# Patient Record
Sex: Female | Born: 1955 | Race: White | Hispanic: No | Smoking: Current every day smoker
Health system: Southern US, Community
[De-identification: ages and names within clinical notes are randomized; demographics above are authoritative.]

## PROBLEM LIST (undated history)

## (undated) DIAGNOSIS — F329 Major depressive disorder, single episode, unspecified: Secondary | ICD-10-CM

## (undated) DIAGNOSIS — F32A Depression, unspecified: Secondary | ICD-10-CM

## (undated) DIAGNOSIS — F419 Anxiety disorder, unspecified: Secondary | ICD-10-CM

## (undated) DIAGNOSIS — I10 Essential (primary) hypertension: Secondary | ICD-10-CM

## (undated) DIAGNOSIS — I669 Occlusion and stenosis of unspecified cerebral artery: Secondary | ICD-10-CM

---

## 2013-01-27 ENCOUNTER — Ambulatory Visit: Payer: Self-pay | Admitting: Family Medicine

## 2013-01-30 ENCOUNTER — Ambulatory Visit: Payer: Self-pay | Admitting: Ophthalmology

## 2013-01-30 LAB — CREATININE, SERUM
Creatinine: 0.87 mg/dL (ref 0.60–1.30)
EGFR (African American): >60
EGFR (Non-African Amer.): >60

## 2013-02-04 ENCOUNTER — Emergency Department: Payer: Self-pay | Admitting: Emergency Medicine

## 2013-03-02 ENCOUNTER — Ambulatory Visit: Payer: Self-pay | Admitting: Vascular Surgery

## 2013-03-23 ENCOUNTER — Inpatient Hospital Stay: Payer: Self-pay | Admitting: Vascular Surgery

## 2013-03-23 LAB — BUN: BUN: 8 mg/dL (ref 7–18)

## 2013-03-23 LAB — CREATININE, SERUM: EGFR (Non-African Amer.): >60

## 2013-03-24 LAB — CBC WITH DIFFERENTIAL/PLATELET
Basophil #: 0.1 10*3/uL (ref 0.0–0.1)
Eosinophil #: 0.2 10*3/uL (ref 0.0–0.7)
Eosinophil %: 2.8 %
HCT: 33 % — ABNORMAL LOW (ref 35.0–47.0)
HGB: 11.4 g/dL — ABNORMAL LOW (ref 12.0–16.0)
Lymphocyte %: 29.3 %
MCV: 94 fL (ref 80–100)
Monocyte #: 0.6 x10 3/mm (ref 0.2–0.9)
Neutrophil #: 4.5 10*3/uL (ref 1.4–6.5)
Platelet: 163 10*3/uL (ref 150–440)
RBC: 3.5 10*6/uL — ABNORMAL LOW (ref 3.80–5.20)
WBC: 7.7 10*3/uL (ref 3.6–11.0)

## 2013-03-24 LAB — BASIC METABOLIC PANEL
BUN: 8 mg/dL (ref 7–18)
Chloride: 111 mmol/L — ABNORMAL HIGH (ref 98–107)
Co2: 25 mmol/L (ref 21–32)
Creatinine: 0.75 mg/dL (ref 0.60–1.30)
EGFR (African American): >60
EGFR (Non-African Amer.): >60
Osmolality: 280 (ref 275–301)
Potassium: 3.7 mmol/L (ref 3.5–5.1)
Sodium: 141 mmol/L (ref 136–145)

## 2013-03-24 LAB — PROTIME-INR: Prothrombin Time: 14.4 secs (ref 11.5–14.7)

## 2013-08-13 ENCOUNTER — Ambulatory Visit: Payer: Self-pay | Admitting: Vascular Surgery

## 2014-03-01 ENCOUNTER — Emergency Department: Payer: Self-pay | Admitting: Emergency Medicine

## 2014-03-01 LAB — CBC WITH DIFFERENTIAL/PLATELET
Basophil #: 0 10*3/uL (ref 0.0–0.1)
Basophil %: 0.4 %
Eosinophil #: 0.2 10*3/uL (ref 0.0–0.7)
Eosinophil %: 2.2 %
HCT: 43.9 % (ref 35.0–47.0)
HGB: 14.3 g/dL (ref 12.0–16.0)
LYMPHS ABS: 2.6 10*3/uL (ref 1.0–3.6)
Lymphocyte %: 34.4 %
MCH: 31.3 pg (ref 26.0–34.0)
MCHC: 32.6 g/dL (ref 32.0–36.0)
MCV: 96 fL (ref 80–100)
MONO ABS: 0.5 x10 3/mm (ref 0.2–0.9)
Monocyte %: 6.7 %
Neutrophil #: 4.2 10*3/uL (ref 1.4–6.5)
Neutrophil %: 56.3 %
Platelet: 210 10*3/uL (ref 150–440)
RBC: 4.57 10*6/uL (ref 3.80–5.20)
RDW: 13.4 % (ref 11.5–14.5)
WBC: 7.4 10*3/uL (ref 3.6–11.0)

## 2014-03-01 LAB — BASIC METABOLIC PANEL
ANION GAP: 8 (ref 7–16)
BUN: 14 mg/dL (ref 7–18)
CO2: 26 mmol/L (ref 21–32)
Calcium, Total: 9.1 mg/dL (ref 8.5–10.1)
Chloride: 106 mmol/L (ref 98–107)
Creatinine: 0.79 mg/dL (ref 0.60–1.30)
EGFR (African American): >60
Glucose: 106 mg/dL — ABNORMAL HIGH (ref 65–99)
OSMOLALITY: 280 (ref 275–301)
POTASSIUM: 4.4 mmol/L (ref 3.5–5.1)
Sodium: 140 mmol/L (ref 136–145)

## 2014-03-01 LAB — D-DIMER(ARMC): D-DIMER: 1743 ng/mL

## 2014-03-01 LAB — PROTIME-INR
INR: 1.1
Prothrombin Time: 13.6 secs (ref 11.5–14.7)

## 2014-03-01 LAB — URIC ACID: Uric Acid: 4.6 mg/dL (ref 2.6–6.0)

## 2014-04-20 IMAGING — CR RIGHT FOOT COMPLETE - 3+ VIEW
1 series · 3 of 3 positions shown · non-contrast
Comparison: none

REASON FOR EXAM: dorsal foot pain
COMMENTS:

[Series 1: ap · 0.17mm/px · 3 of 3 slices shown]
[im 1/3]
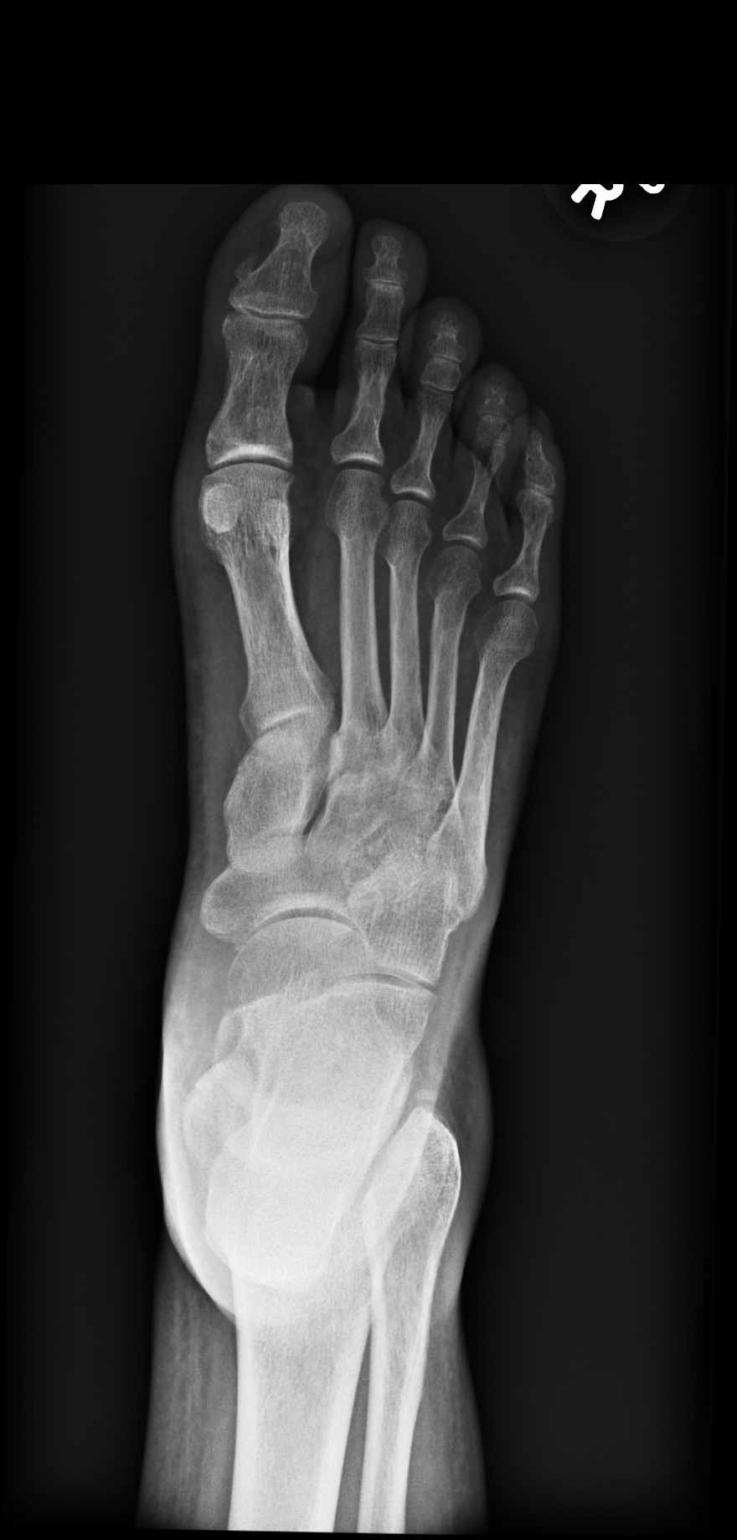
[im 2/3]
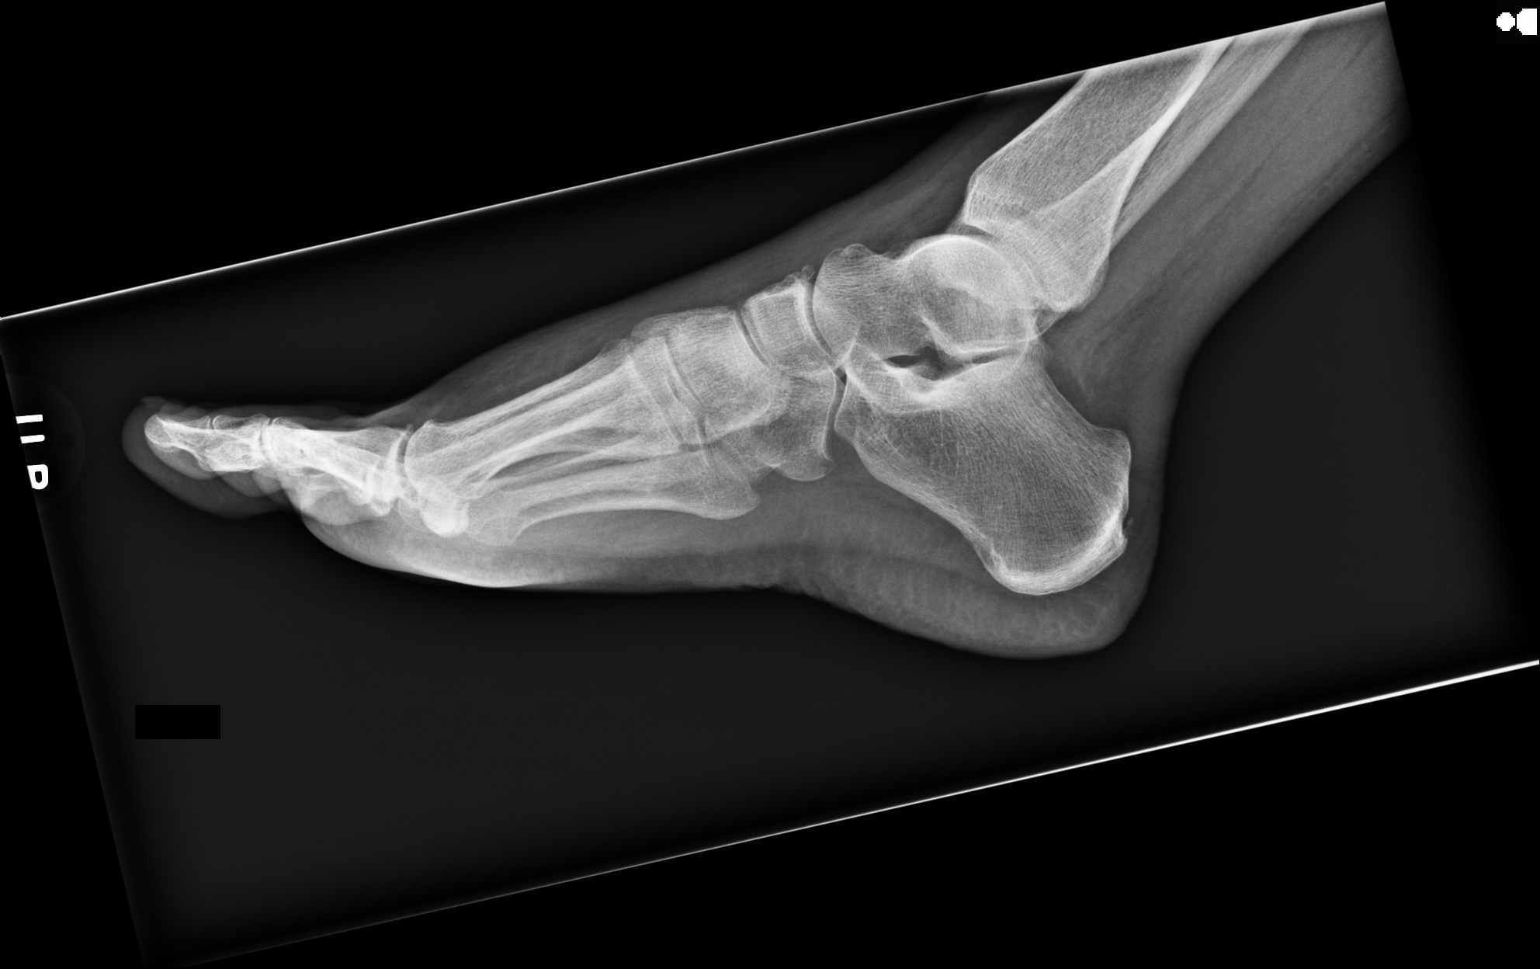
[im 3/3]
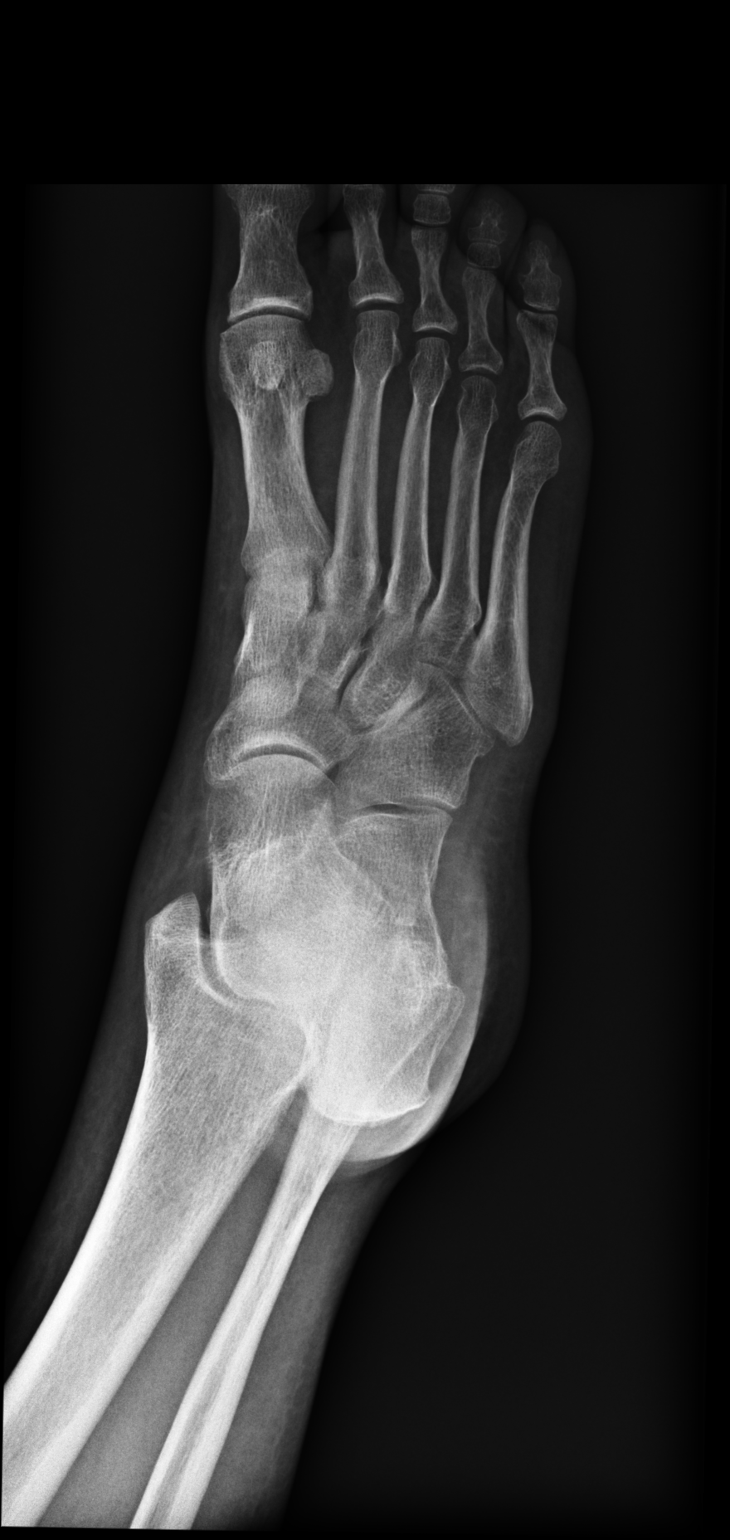

[3 of 3 positions shown; findings below may reference images not displayed]

PROCEDURE:     DXR - DXR FOOT RT COMPLETE W/OBLIQUES  - February 04, 2013  [DATE]

RESULT:     Three views of the right foot reveal mild osteopenia. There is
no lytic or blastic lesion. No erosive changes are demonstrated. There is
mild narrowing of the first metatarsophalangeal joint. There is no evidence
of periosteal reaction. There is a tiny Achilles region calcaneal spur. The
overlying soft tissues exhibit no acute abnormalities.
IMPRESSION: There are no radiographic findings to suggest erosive
arthropathy. No more than mild osteoarthritic changes are demonstrated.

[REDACTED]

## 2014-11-26 NOTE — Op Note (Signed)
PATIENT NAME:  Valerie Orozco, [name] MR#:  [id number] DATE OF BIRTH:  02-11-1956  DATE OF PROCEDURE:  03/23/2013  PREOPERATIVE DIAGNOSES: 1.  High-grade left carotid artery stenosis.  2.  Previous amaurosis fugax, left eye.  3.  Hypertension.  4.  Anxiety/depression.   POSTOPERATIVE DIAGNOSES: 1.  High-grade left carotid artery stenosis.  2.  Previous amaurosis fugax, left eye.  3.  Hypertension.  4.  Anxiety/depression.   PROCEDURES: 1.  Ultrasound guidance for vascular access, right femoral artery.  2.  Catheter placement to left internal carotid artery from right femoral approach.  3.  Thoracic aortogram and left cervical and cerebral carotid angiograms.  4.  Placement of a 9 x 7 tapered Xact carotid stent with the use of the NAV6 embolic protection device.  5.  StarClose closure device,  right femoral artery.   SURGEON: Annice NeedyJason S. Zulema Pulaski, M.D.   ANESTHESIA: Local with moderate sedation.   ESTIMATED BLOOD LOSS: Approximately 100 mL.   FLUOROSCOPY TIME: 17 minutes.  CONTRAST USED:  75 mL.   INDICATION FOR PROCEDURE: This is a 59 year old white female who I saw in the office for carotid disease. She had an episode of amaurosis fugax with gradual improvement of her visual symptoms. She had on ultrasound and then a CT angiogram, both of which suggested high-grade left carotid artery stenosis. This was on the side ipsilateral to her visual symptoms. Her CT angiogram also demonstrated a high carotid bifurcation with a lesion that would be difficult to treat surgically, and for this reason we discussed options for treatment, and a carotid artery stent was selected. Risks and benefits of the procedure were discussed in detail with the patient and informed consent was obtained.   DESCRIPTION OF PROCEDURE: The patient is brought to the vascular and ventral radiology suite. Groins were sterilely prepped and draped, and a sterile surgical field was created. The right femoral head was localized with  fluoroscopy, and the right femoral artery was accessed without difficulty with Seldinger needle. A J-wire and 6-French sheath were then placed. The patient was given 6000 units of intravenous heparin for systemic anticoagulation, and her ACT was found to be over 300 seconds. A pigtail catheter was then placed in the ascending aorta and LAO projection thoracic aortogram was performed. This demonstrated a difficult arch for cannulation with significant reverse curve of the left common carotid artery. There were separate origins of the innominate, left common carotid and left subclavian arteries. I initially tried a JB-2 and then a Bentson-Hanafee catheter but was unable to get these to track with the difficult reverse curve in the left common carotid artery. I had to get a VTK catheter and had to selectively wire the external carotid artery well out into its secondary branches to be able to get purchase to advance the VTK catheter. Once this advanced, I advanced it all the way into the external carotid artery, and then placed the Amplatz Super Stiff wire with a 1 cm floppy tip over this platform. The diagnostic catheter was removed, and a 6-French Shuttle sheath was introduced into the left common carotid artery. At this point, the stiff wire and dilator were removed. Imaging was performed, which demonstrated a 95% stenosis of the left internal carotid artery, less than 1 cm beyond its origin. There was a significant amount of tortuosity and nearly a kink in the internal carotid artery 4 to 5 cm beyond the lesion. Initially, the NAV6 embolic protection device and wire were able to cross the actual  lesion but did not want to make the curve around the kinked portion. At this point, I used a floppy Glidewire as a buddy wire system, and with this in place, I replaced the NAV6 embolic protection device without difficulty into the distal internal carotid artery, removing the Glidewire. Imaging was then performed in the  cervical and cerebral portions. The intracerebral flow was found to be negligible through the anterior cerebral artery and diminished through the middle cerebral artery on initial imaging. The cervical lesion was greater than 90 to 95% in both projections, in lateral and in LAO projection. A 97 tapered 4 cm long Xact stent was then brought onto the field and introduced over the wire encompassing the lesion. She was pretreated with atropine as her baseline heart rate was in the 50s. The stent was deployed encompassing the lesion in excellent location, and then was postdilated with a 5 mm diameter angioplasty balloon with excellent angiographic completion result and less than 20% residual stenosis. After pretreatment with the atropine, she did not develop any bradycardia or hypotension. The embolic protection device was then retrieved and completion angiogram imaging was performed. This showed the excellent stent location and patent stent. The intracerebral flow now showed a small amount of flow in the anterior cerebral artery, which was not present pre-stent placement, and the middle cerebral artery also was slightly more brisk without a focal deficit appreciated. The patient remained neurologically intact throughout the procedure. I then brought the sheath down to the external iliac artery and an oblique arteriogram was performed. StarClose closure device was deployed in the usual fashion with good hemostatic result, and pressure was held on the groin as well. Sterile pressure dressing was placed. The patient tolerated the procedure well and was taken to the recovery room in stable condition.      ____________________________ Annice Needy, MD jsd:dmm D: 03/23/2013 09:36:24 ET T: 03/23/2013 09:51:05 ET JOB#: [id number]  cc: Annice Needy, MD, <Dictator> Deloris Ping, NP Annice Needy MD ELECTRONICALLY SIGNED 03/23/2013 16:09

## 2015-08-15 ENCOUNTER — Emergency Department
Admission: EM | Admit: 2015-08-15 | Discharge: 2015-08-15 | Disposition: A | Discharge status: Home / Self Care | Payer: Medicare Other | Attending: Emergency Medicine | Admitting: Emergency Medicine

## 2015-08-15 ENCOUNTER — Encounter: Payer: Self-pay | Admitting: Emergency Medicine

## 2015-08-15 DIAGNOSIS — B029 Zoster without complications: Secondary | ICD-10-CM | POA: Insufficient documentation

## 2015-08-15 DIAGNOSIS — R21 Rash and other nonspecific skin eruption: Secondary | ICD-10-CM | POA: Diagnosis present

## 2015-08-15 DIAGNOSIS — F17200 Nicotine dependence, unspecified, uncomplicated: Secondary | ICD-10-CM | POA: Insufficient documentation

## 2015-08-15 DIAGNOSIS — I10 Essential (primary) hypertension: Secondary | ICD-10-CM | POA: Diagnosis not present

## 2015-08-15 HISTORY — DX: Anxiety disorder, unspecified: F41.9

## 2015-08-15 HISTORY — DX: Occlusion and stenosis of unspecified cerebral artery: I66.9

## 2015-08-15 HISTORY — DX: Essential (primary) hypertension: I10

## 2015-08-15 HISTORY — DX: Major depressive disorder, single episode, unspecified: F32.9

## 2015-08-15 HISTORY — DX: Depression, unspecified: F32.A

## 2015-08-15 MED ORDER — OXYCODONE-ACETAMINOPHEN 5-325 MG PO TABS
1.0000 | ORAL_TABLET | Freq: Once | ORAL | Status: AC
  Administered 2015-08-15: 1 via ORAL
  Filled 2015-08-15: qty 1

## 2015-08-15 MED ORDER — OXYCODONE-ACETAMINOPHEN 5-325 MG PO TABS: 1.0000 | ORAL_TABLET | Freq: Four times a day (QID) | ORAL | Status: AC | PRN

## 2015-08-15 MED ORDER — VALACYCLOVIR HCL 500 MG PO TABS
500.0000 mg | ORAL_TABLET | Freq: Two times a day (BID) | ORAL | Status: AC
Start: 2015-08-15 — End: 2015-08-20

## 2015-08-15 NOTE — ED Notes (Signed)
Pt with red scabbed area left cheek. Looks like shingles. Pt with hx shingles.

## 2015-08-15 NOTE — Discharge Instructions (Signed)
Shingles Shingles is an infection that causes a painful skin rash and fluid-filled blisters. Shingles is caused by the same virus that causes chickenpox. Shingles only develops in people who:  Have had chickenpox.  Have gotten the chickenpox vaccine. (This is rare.) The first symptoms of shingles may be itching, tingling, or pain in an area on your skin. A rash will follow in a few days or weeks. The rash is usually on one side of the body in a bandlike or beltlike pattern. Over time, the rash turns into fluid-filled blisters that break open, scab over, and dry up. Medicines may:  Help you manage pain.  Help you recover more quickly.  Help to prevent long-term problems. HOME CARE Medicines  Take medicines only as told by your doctor.  Apply an anti-itch or numbing cream to the affected area as told by your doctor. Blister and Rash Care  Take a cool bath or put cool compresses on the area of the rash or blisters as told by your doctor. This may help with pain and itching.  Keep your rash covered with a loose bandage (dressing). Wear loose-fitting clothing.  Keep your rash and blisters clean with mild soap and cool water or as told by your doctor.  Check your rash every day for signs of infection. These include redness, swelling, and pain that lasts or gets worse.  Do not pick your blisters.  Do not scratch your rash. General Instructions  Rest as told by your doctor.  Keep all follow-up visits as told by your doctor. This is important.  Until your blisters scab over, your infection can cause chickenpox in people who have never had it or been vaccinated against it. To prevent this from happening, avoid touching other people or being around other people, especially:  Babies.  Pregnant women.  Children who have eczema.  Elderly people who have transplants.  People who have chronic illnesses, such as leukemia or AIDS. GET HELP IF:  Your pain does not get better with  medicine.  Your pain does not get better after the rash heals.  Your rash looks infected. Signs of infection include:  Redness.  Swelling.  Pain that lasts or gets worse. GET HELP RIGHT AWAY IF:  The rash is on your face or nose.  You have pain in your face, pain around your eye area, or loss of feeling on one side of your face.  You have ear pain or you have ringing in your ear.  You have loss of taste.  Your condition gets worse.   This information is not intended to replace advice given to you by your health care provider. Make sure you discuss any questions you have with your health care provider.   Document Released: 01/09/2008 Document Revised: 08/13/2014 Document Reviewed: 05/04/2014 Elsevier Interactive Patient Education 2016 [employer]  

## 2015-08-15 NOTE — ED Notes (Signed)
Pt to ed with c/o right cheek rash.  Pt states " I think it is shingles"  Pt reports she tried to see MD on Friday.

## 2015-08-15 NOTE — ED Provider Notes (Signed)
[facility] Emergency Department Provider Note  ____________________________________________  Time seen: Approximately 4:35 PM  I have reviewed the triage vital signs and the nursing notes.   HISTORY  Chief Complaint Rash    HPI Valerie Orozco is a 60 y.o. female patient complaining really painful vesicle lesions left cheek. Patient is consistent with her past episodes patient denies any fever or other cold symptoms. Patient states the pain is a burning sensation he rates a 5/10. No palliative measures taken for this complaint.  Past Medical History  Diagnosis Date  . Blood clots in brain   . Hypertension   . Anxiety   . Depression     There are no active problems to display for this patient.   History reviewed. No pertinent past surgical history.  Current Outpatient Rx  Name  Route  Sig  Dispense  Refill  . oxyCODONE-acetaminophen (ROXICET) 5-325 MG tablet   Oral   Take 1 tablet by mouth every 6 (six) hours as needed for moderate pain.   12 tablet   0   . valACYclovir (VALTREX) 500 MG tablet   Oral   Take 1 tablet (500 mg total) by mouth 2 (two) times daily.   10 tablet   0     Allergies Codeine  History reviewed. No pertinent family history.  Social History Social History  Substance Use Topics  . Smoking status: Current Every Day Smoker  . Smokeless tobacco: None  . Alcohol Use: No    Review of Systems Constitutional: No fever/chills Eyes: No visual changes. ENT: No sore throat. Cardiovascular: Denies chest pain. Respiratory: Denies shortness of breath. Gastrointestinal: No abdominal pain.  No nausea, no vomiting.  No diarrhea.  No constipation. Genitourinary: Negative for dysuria. Musculoskeletal: Negative for back pain. Skin: Vesicle lesions on face. Neurological: Negative for headaches, focal weakness or numbness. Psychiatric:Anxiety and depression Endocrine:Hypertension Allergic/Immunilogical: Codeine 10-point ROS  otherwise negative.  ____________________________________________   PHYSICAL EXAM:  VITAL SIGNS: ED Triage Vitals  Enc Vitals Group     BP 08/15/15 1501 110/74 mmHg     Pulse Rate 08/15/15 1501 50     Resp 08/15/15 1501 16     Temp 08/15/15 1501 98.1 F (36.7 C)     Temp Source 08/15/15 1501 Oral     SpO2 08/15/15 1501 95 %     Weight 08/15/15 1501 146 lb (66.225 kg)     Height 08/15/15 1501 5\' 2"  (1.575 m)     Head Cir --      Peak Flow --      Pain Score 08/15/15 1454 0     Pain Loc --      Pain Edu? --      Excl. in GC? --     Constitutional: Alert and oriented. Well appearing and in no acute distress. Eyes: Conjunctivae are normal. PERRL. EOMI. Head: Atraumatic. Nose: No congestion/rhinnorhea. Mouth/Throat: Mucous membranes are moist.  Oropharynx non-erythematous. Neck: No stridor.  No cervical spine tenderness to palpation. Hematological/Lymphatic/Immunilogical: No cervical lymphadenopathy. Cardiovascular: Normal rate, regular rhythm. Grossly normal heart sounds.  Good peripheral circulation. Respiratory: Normal respiratory effort.  No retractions. Lungs CTAB. Gastrointestinal: Soft and nontender. No distention. No abdominal bruits. No CVA tenderness. Musculoskeletal: No lower extremity tenderness nor edema.  No joint effusions. Neurologic:  Normal speech and language. No gross focal neurologic deficits are appreciated. No gait instability. Skin:  Skin is warm, dry and intact. Vesicle lesions left maxillary area Psychiatric: Mood and affect are normal.  Speech and behavior are normal.  ____________________________________________   LABS (all labs ordered are listed, but only abnormal results are displayed)  Labs Reviewed - No data to display ____________________________________________  EKG   ____________________________________________  RADIOLOGY   ____________________________________________   PROCEDURES  Procedure(s) performed: None  Critical  Care performed: No  ____________________________________________   INITIAL IMPRESSION / ASSESSMENT AND PLAN / ED COURSE  Pertinent labs & imaging results that were available during my care of the patient were reviewed by me and considered in my medical decision making (see chart for details).  Herpes zoster. Patient given prescription for Valtrex and Percocets. Patient advised see family doctor Varicella  vaccine. ____________________________________________   FINAL CLINICAL IMPRESSION(S) / ED DIAGNOSES  Final diagnoses:  Herpes zoster      Joni ReiningRonald K Smith, PA-C 08/15/15 1653  Emily FilbertJonathan E Williams, MD 08/16/15 [phone]

## 2018-07-23 ENCOUNTER — Other Ambulatory Visit: Payer: Self-pay

## 2018-07-23 ENCOUNTER — Emergency Department
Admission: EM | Admit: 2018-07-23 | Discharge: 2018-07-23 | Disposition: A | Discharge status: Home / Self Care | Payer: Medicare Other | Attending: Emergency Medicine | Admitting: Emergency Medicine

## 2018-07-23 ENCOUNTER — Emergency Department: Payer: Medicare Other

## 2018-07-23 ENCOUNTER — Encounter: Payer: Self-pay | Admitting: Emergency Medicine

## 2018-07-23 DIAGNOSIS — F17210 Nicotine dependence, cigarettes, uncomplicated: Secondary | ICD-10-CM | POA: Diagnosis not present

## 2018-07-23 DIAGNOSIS — R69 Illness, unspecified: Secondary | ICD-10-CM

## 2018-07-23 DIAGNOSIS — R05 Cough: Secondary | ICD-10-CM | POA: Diagnosis present

## 2018-07-23 DIAGNOSIS — R112 Nausea with vomiting, unspecified: Secondary | ICD-10-CM | POA: Diagnosis not present

## 2018-07-23 DIAGNOSIS — J111 Influenza due to unidentified influenza virus with other respiratory manifestations: Secondary | ICD-10-CM | POA: Insufficient documentation

## 2018-07-23 DIAGNOSIS — R0602 Shortness of breath: Secondary | ICD-10-CM | POA: Insufficient documentation

## 2018-07-23 DIAGNOSIS — Z760 Encounter for issue of repeat prescription: Secondary | ICD-10-CM | POA: Insufficient documentation

## 2018-07-23 DIAGNOSIS — I10 Essential (primary) hypertension: Secondary | ICD-10-CM | POA: Insufficient documentation

## 2018-07-23 DIAGNOSIS — Z720 Tobacco use: Secondary | ICD-10-CM

## 2018-07-23 LAB — URINALYSIS, COMPLETE (UACMP) WITH MICROSCOPIC
Bacteria, UA: NONE SEEN
Bilirubin Urine: NEGATIVE
Glucose, UA: NEGATIVE mg/dL
Hgb urine dipstick: NEGATIVE
Ketones, ur: NEGATIVE mg/dL
Leukocytes, UA: NEGATIVE
Nitrite: NEGATIVE
Protein, ur: NEGATIVE mg/dL
Specific Gravity, Urine: 1.008 (ref 1.005–1.030)
pH: 6 (ref 5.0–8.0)

## 2018-07-23 LAB — CBC
HCT: 48.3 % — ABNORMAL HIGH (ref 36.0–46.0)
Hemoglobin: 15.8 g/dL — ABNORMAL HIGH (ref 12.0–15.0)
MCH: 32 pg (ref 26.0–34.0)
MCHC: 32.7 g/dL (ref 30.0–36.0)
MCV: 98 fL (ref 80.0–100.0)
Platelets: 214 10*3/uL (ref 150–400)
RBC: 4.93 MIL/uL (ref 3.87–5.11)
RDW: 12.9 % (ref 11.5–15.5)
WBC: 10.8 10*3/uL — ABNORMAL HIGH (ref 4.0–10.5)
nRBC: 0 % (ref 0.0–0.2)

## 2018-07-23 LAB — COMPREHENSIVE METABOLIC PANEL
ALT: 13 U/L (ref 0–44)
AST: 20 U/L (ref 15–41)
Albumin: 4.7 g/dL (ref 3.5–5.0)
Alkaline Phosphatase: 77 U/L (ref 38–126)
Anion gap: 12 (ref 5–15)
BUN: 13 mg/dL (ref 8–23)
CO2: 26 mmol/L (ref 22–32)
Calcium: 9.7 mg/dL (ref 8.9–10.3)
Chloride: 100 mmol/L (ref 98–111)
Creatinine, Ser: 1.09 mg/dL — ABNORMAL HIGH (ref 0.44–1.00)
GFR calc Af Amer: >60 mL/min (ref >60)
GFR calc non Af Amer: 54 mL/min — ABNORMAL LOW (ref >60)
Glucose, Bld: 129 mg/dL — ABNORMAL HIGH (ref 70–99)
Potassium: 4.5 mmol/L (ref 3.5–5.1)
SODIUM: 138 mmol/L (ref 135–145)
Total Bilirubin: 0.5 mg/dL (ref 0.3–1.2)
Total Protein: 7.8 g/dL (ref 6.5–8.1)

## 2018-07-23 LAB — LIPASE, BLOOD: Lipase: 40 U/L (ref 11–51)

## 2018-07-23 LAB — INFLUENZA PANEL BY PCR (TYPE A & B)
Influenza A By PCR: NEGATIVE
Influenza B By PCR: NEGATIVE

## 2018-07-23 MED ORDER — ONDANSETRON 4 MG PO TBDP: 4.0000 mg | ORAL_TABLET | Freq: Three times a day (TID) | ORAL | 0 refills | Status: AC | PRN

## 2018-07-23 MED ORDER — BENZONATATE 100 MG PO CAPS: 100.0000 mg | ORAL_CAPSULE | Freq: Four times a day (QID) | ORAL | 0 refills | Status: AC | PRN

## 2018-07-23 MED ORDER — PREDNISONE 20 MG PO TABS: 60.0000 mg | ORAL_TABLET | Freq: Every day | ORAL | 0 refills | Status: AC

## 2018-07-23 MED ORDER — AZITHROMYCIN 250 MG PO TABS: ORAL_TABLET | ORAL | 0 refills | Status: AC

## 2018-07-23 MED ORDER — GABAPENTIN 300 MG PO CAPS: 300.0000 mg | ORAL_CAPSULE | Freq: Every day | ORAL | 0 refills | Status: DC

## 2018-07-23 MED ORDER — IPRATROPIUM-ALBUTEROL 0.5-2.5 (3) MG/3ML IN SOLN
3.0000 mL | Freq: Once | RESPIRATORY_TRACT | Status: AC
  Administered 2018-07-23: 3 mL via RESPIRATORY_TRACT
  Filled 2018-07-23: qty 3

## 2018-07-23 MED ORDER — TOPIRAMATE 25 MG PO TABS: 25.0000 mg | ORAL_TABLET | Freq: Every day | ORAL | 0 refills | Status: DC

## 2018-07-23 MED ORDER — AMITRIPTYLINE HCL 50 MG PO TABS: 150.0000 mg | ORAL_TABLET | Freq: Every day | ORAL | 0 refills | Status: DC

## 2018-07-23 NOTE — ED Notes (Signed)
AAOx3.  Skin warm and dry. NAD>  States has been sneezing and coughing x 8 days.  C/O abdomen hurting from coughing and sneezing.  Also reports some diarrhea.  Patient is AAOx3.  Skin warm and dry. NAD

## 2018-07-23 NOTE — Discharge Instructions (Addendum)
Please take your albuterol inhaler for coughing spasms.  Take the entire course of steroids and antibiotics, even if you are feeling better.  In addition, you may try Tessalon Perles for cough.  Return to the emergency department if you develop severe pain, lightheadedness or fainting, shortness of breath, inability to keep down fluids, chest pain, or any other symptoms concerning to you.

## 2018-07-23 NOTE — ED Triage Notes (Signed)
Patient reports cough, congestion and sneezing for the last week. Unsure of fever at home. Patient states she has been taking OTC cold medication at home with minimal relief.

## 2018-07-23 NOTE — ED Notes (Signed)
AAOx3.  Skin warm and dry.  NAD 

## 2018-07-23 NOTE — ED Provider Notes (Addendum)
[facility] Emergency Department Provider Note  ____________________________________________  Time seen: Approximately 3:47 PM  I have reviewed the triage vital signs and the nursing notes.   HISTORY  Chief Complaint Cough and Abdominal Pain    HPI Valerie Orozco is a 62 y.o. female smokes 2 packs of cigarettes per day, with a history of HTN and COPD presenting with cough, shortness of breath, nausea and vomiting.  The patient reports that for 2 weeks, she has had a nonproductive cough with some mild congestion and rhinorrhea.  Initially, her symptoms seem to improve with over the counter Coricidin, but over the last several days, her symptoms have returned, and she developed some nausea and vomiting with diarrhea.  Today, she has not had any of the symptoms but does have ongoing cough and some shortness of breath.  She has not had her influenza vaccination this year.  In addition, the patient recently moved to the area and has not reestablished care with a primary care physician; she is requesting refills of all of her prescription medications today.   Past Medical History:  Diagnosis Date  . Anxiety   . Blood clots in brain   . Depression   . Hypertension     There are no active problems to display for this patient.   History reviewed. No pertinent surgical history.  Current Outpatient Rx  . Order #: [id number] Class: Print  . Order #: [id number] Class: Print  . Order #: [id number] Class: Print  . Order #: [id number] Class: Print  . Order #: [id number] Class: Print  . Order #: [id number] Class: Print  . Order #: [id number] Class: Print  . Order #: [id number] Class: Print    Allergies Codeine  No family history on file.  Social History Social History   Tobacco Use  . Smoking status: Current Every Day Smoker  Substance Use Topics  . Alcohol use: No  . Drug use: No    Review of Systems Constitutional: No fever/chills.  No lightheadedness or syncope.   No diaphoresis.  Positive body aches, now resolved. Eyes: No visual changes.  No eye discharge. ENT: No sore throat.  Mild congestion with clear rhinorrhea, now resolved..  No ear pain. Cardiovascular: Denies chest pain. Denies palpitations. Respiratory: As of exertional mild shortness of breath.  Positive nonproductive cough. Gastrointestinal: No abdominal pain.  As of nausea vomiting and diarrhea yesterday, now resolved.  No constipation. Genitourinary: Negative for dysuria. Musculoskeletal: Negative for back pain. Skin: Negative for rash. Neurological: Negative for headaches. No focal numbness, tingling or weakness.     ____________________________________________   PHYSICAL EXAM:  VITAL SIGNS: ED Triage Vitals  Enc Vitals Group     BP 07/23/18 1201 (!) 156/102     Pulse Rate 07/23/18 1201 65     Resp 07/23/18 1201 20     Temp 07/23/18 1201 98.6 F (37 C)     Temp src --      SpO2 07/23/18 1201 95 %     Weight 07/23/18 1200 135 lb (61.2 kg)     Height 07/23/18 1200 5\' 2"  (1.575 m)     Head Circumference --      Peak Flow --      Pain Score 07/23/18 1206 9     Pain Loc --      Pain Edu? --      Excl. in GC? --     Constitutional: Alert and oriented. Answers questions appropriately.  Chronically ill-appearing. Eyes: Conjunctivae are normal.  EOMI. No scleral icterus.  Head: Atraumatic. Nose: Congestion without any active rhinorrhea. Mouth/Throat: Mucous membranes are moist.  No posterior pharyngeal erythema, tonsillar swelling or exudate.  The posterior palate is symmetric and the uvula is midline.  No stridor, hoarse voice or drooling. EARS: Ems are clear without any bulge, fluid or erythema bilaterally.  The canals are clear as well. Neck: No stridor.  Supple.  No JVD.  No meningismus. Cardiovascular: Normal rate, regular rhythm. No murmurs, rubs or gallops.  Respiratory: Patient has normal respiratory rate with a prolonged expiratory phase.  She has minimal end  expiratory wheezing without any focal rales or rhonchi.  She is able to speak in full sentences without difficulty.  Her oxygen saturation in the emergency department is 95% on room air. Gastrointestinal: Soft, nontender and nondistended.  No guarding or rebound.  No peritoneal signs. Musculoskeletal: No LE edema. No ttp in the calves or palpable cords.  Negative Homan's sign. Neurologic:  A&Ox3.  Speech is clear.  Face and smile are symmetric.  EOMI.  Moves all extremities well. Skin:  Skin is warm, dry and intact. No rash noted. Psychiatric: Mood and affect are normal. Speech and behavior are normal.  Normal judgement  ____________________________________________   LABS (all labs ordered are listed, but only abnormal results are displayed)  Labs Reviewed  COMPREHENSIVE METABOLIC PANEL - Abnormal; Notable for the following components:      Result Value   Glucose, Bld 129 (*)    Creatinine, Ser 1.09 (*)    GFR calc non Af Amer 54 (*)    All other components within normal limits  CBC - Abnormal; Notable for the following components:   WBC 10.8 (*)    Hemoglobin 15.8 (*)    HCT 48.3 (*)    All other components within normal limits  URINALYSIS, COMPLETE (UACMP) WITH MICROSCOPIC - Abnormal; Notable for the following components:   Color, Urine YELLOW (*)    APPearance CLEAR (*)    All other components within normal limits  LIPASE, BLOOD  INFLUENZA PANEL BY PCR (TYPE A & B)   ____________________________________________  EKG  ED ECG REPORT I, Anne-Caroline Sharma Covert, the attending physician, personally viewed and interpreted this ECG.   Date: 07/23/2018  EKG Time: 1222  Rate: 61  Rhythm: normal sinus rhythm  Axis: normal  Intervals:none  ST&T Change: No STEMI  Patient's chart has been reviewed and there is no EKG for comparison.  ____________________________________________  RADIOLOGY  Dg Chest 2 View  Result Date: 07/23/2018 CLINICAL DATA:  Cough and short of breath  EXAM: CHEST - 2 VIEW COMPARISON:  None. FINDINGS: The heart size and mediastinal contours are within normal limits. Both lungs are clear. The visualized skeletal structures are unremarkable. IMPRESSION: No active cardiopulmonary disease. Electronically Signed   By: Marlan Palau M.D.   On: 07/23/2018 12:58    ____________________________________________   PROCEDURES  Procedure(s) performed: None  Procedures  Critical Care performed: No ____________________________________________   INITIAL IMPRESSION / ASSESSMENT AND PLAN / ED COURSE  Pertinent labs & imaging results that were available during my care of the patient were reviewed by me and considered in my medical decision making (see chart for details).  62 y.o. female with a history of COPD, ongoing tobacco abuse, presenting with 2 weeks of URI symptoms as well as some nausea vomiting and diarrhea with body aches.  Overall, the patient symptoms are most consistent with influenza or flulike illness, especially since she has not had her influenza vaccination this year.  She  may also be presenting with a URI or bronchitis.  Her chest x-ray does not show a pneumonia, but given her ongoing tobacco abuse and underlying lung disease, we will treat her with a 5-day steroid course and antibiotics to see if this can improve her symptoms.  I was also encouraged her to follow-up with her primary care physician in the area to establish care.  Here, the patient will receive a DuoNeb for her minimal end expiratory wheezing, and we will do an influenza swab.  I anticipate discharge with close PMD follow-up.  I did discuss return precautions with the patient.  ----------------------------------------- 5:11 PM on 07/23/2018 -----------------------------------------  The patient's influenza testing is negative today.  We will plan to discharge the patient home at this time.  I did discuss follow-up instructions as well as return  precautions.  ____________________________________________  FINAL CLINICAL IMPRESSION(S) / ED DIAGNOSES  Final diagnoses:  Tobacco abuse  Medication refill  Influenza-like illness         NEW MEDICATIONS STARTED DURING THIS VISIT:  New Prescriptions   AMITRIPTYLINE (ELAVIL) 50 MG TABLET    Take 3 tablets (150 mg total) by mouth at bedtime.   AZITHROMYCIN (ZITHROMAX Z-PAK) 250 MG TABLET    Take 2 tablets (500 mg) on  Day 1,  followed by 1 tablet (250 mg) once daily on Days 2 through 5.   BENZONATATE (TESSALON PERLES) 100 MG CAPSULE    Take 1 capsule (100 mg total) by mouth every 6 (six) hours as needed for cough.   GABAPENTIN (NEURONTIN) 300 MG CAPSULE    Take 1 capsule (300 mg total) by mouth at bedtime.   ONDANSETRON (ZOFRAN ODT) 4 MG DISINTEGRATING TABLET    Take 1 tablet (4 mg total) by mouth every 8 (eight) hours as needed for nausea or vomiting.   PREDNISONE (DELTASONE) 20 MG TABLET    Take 3 tablets (60 mg total) by mouth daily.   TOPIRAMATE (TOPAMAX) 25 MG TABLET    Take 1 tablet (25 mg total) by mouth at bedtime.      Rockne MenghiniNorman, Anne-Caroline, MD 07/23/18 1553    Rockne MenghiniNorman, Anne-Caroline, MD 07/23/18 [phone]

## 2018-12-23 ENCOUNTER — Emergency Department
Admission: EM | Admit: 2018-12-23 | Discharge: 2018-12-23 | Disposition: A | Discharge status: Home / Self Care | Payer: Medicare Other | Attending: Emergency Medicine | Admitting: Emergency Medicine

## 2018-12-23 ENCOUNTER — Other Ambulatory Visit: Payer: Self-pay

## 2018-12-23 DIAGNOSIS — I10 Essential (primary) hypertension: Secondary | ICD-10-CM | POA: Insufficient documentation

## 2018-12-23 DIAGNOSIS — F17210 Nicotine dependence, cigarettes, uncomplicated: Secondary | ICD-10-CM | POA: Insufficient documentation

## 2018-12-23 DIAGNOSIS — R21 Rash and other nonspecific skin eruption: Secondary | ICD-10-CM | POA: Diagnosis present

## 2018-12-23 DIAGNOSIS — B029 Zoster without complications: Secondary | ICD-10-CM | POA: Diagnosis not present

## 2018-12-23 MED ORDER — TETRACAINE HCL 0.5 % OP SOLN
2.0000 [drp] | Freq: Once | OPHTHALMIC | Status: AC
  Administered 2018-12-23: 2 [drp] via OPHTHALMIC
  Filled 2018-12-23: qty 4

## 2018-12-23 MED ORDER — AMITRIPTYLINE HCL 50 MG PO TABS: 150.0000 mg | ORAL_TABLET | Freq: Every day | ORAL | 1 refills | Status: AC

## 2018-12-23 MED ORDER — GABAPENTIN 300 MG PO CAPS: 300.0000 mg | ORAL_CAPSULE | Freq: Every day | ORAL | 1 refills | Status: AC

## 2018-12-23 MED ORDER — OXYCODONE-ACETAMINOPHEN 5-325 MG PO TABS
2.0000 | ORAL_TABLET | Freq: Once | ORAL | Status: AC
  Administered 2018-12-23: 2 via ORAL
  Filled 2018-12-23: qty 2

## 2018-12-23 MED ORDER — FLUORESCEIN SODIUM 1 MG OP STRP
1.0000 | ORAL_STRIP | Freq: Once | OPHTHALMIC | Status: AC
  Administered 2018-12-23: 1 via OPHTHALMIC
  Filled 2018-12-23: qty 1

## 2018-12-23 MED ORDER — MUPIROCIN 2 % EX OINT: TOPICAL_OINTMENT | CUTANEOUS | 0 refills | Status: AC

## 2018-12-23 MED ORDER — TRAMADOL HCL 50 MG PO TABS: 50.0000 mg | ORAL_TABLET | Freq: Four times a day (QID) | ORAL | 0 refills | Status: AC | PRN

## 2018-12-23 MED ORDER — VALACYCLOVIR HCL 1 G PO TABS: 2000.0000 mg | ORAL_TABLET | Freq: Three times a day (TID) | ORAL | 0 refills | Status: AC

## 2018-12-23 MED ORDER — PREDNISONE 10 MG (21) PO TBPK: ORAL_TABLET | ORAL | 0 refills | Status: AC

## 2018-12-23 MED ORDER — TOPIRAMATE 25 MG PO TABS: 25.0000 mg | ORAL_TABLET | Freq: Every day | ORAL | 1 refills | Status: AC

## 2018-12-23 NOTE — ED Triage Notes (Signed)
Pt c/o painful blistery rash on the left side of the face since Friday,.

## 2018-12-23 NOTE — ED Notes (Signed)
ED Provider Williams at bedside at this time

## 2018-12-23 NOTE — ED Provider Notes (Signed)
[facility] Emergency Department Provider Note       Time seen: ----------------------------------------- 10:41 AM on 12/23/2018 -----------------------------------------   I have reviewed the triage vital signs and the nursing notes.  HISTORY   Chief Complaint Herpes Zoster   HPI Valerie Orozco is a 63 y.o. female with a history of anxiety, depression, hypertension who presents to the ED for a painful blistery rash to the left side of her face since Friday.  Currently pain is 7 out of 10.  She has had some blurry vision.  She reports a history of shingles in the past on that side of her face.  Past Medical History:  Diagnosis Date  . Anxiety   . Blood clots in brain   . Depression   . Hypertension     There are no active problems to display for this patient.   History reviewed. No pertinent surgical history.  Allergies Codeine  Social History Social History   Tobacco Use  . Smoking status: Current Every Day Smoker  . Smokeless tobacco: Never Used  Substance Use Topics  . Alcohol use: No  . Drug use: No   Review of Systems Constitutional: Negative for fever. Cardiovascular: Negative for chest pain. Respiratory: Negative for shortness of breath. Gastrointestinal: Negative for abdominal pain, vomiting and diarrhea. Musculoskeletal: Negative for back pain. Skin: Positive for facial rash Neurological: Negative for headaches, focal weakness or numbness.  All systems negative/normal/unremarkable except as stated in the HPI  ____________________________________________   PHYSICAL EXAM:  VITAL SIGNS: ED Triage Vitals [12/23/18 1030]  Enc Vitals Group     BP (!) 145/84     Pulse Rate 70     Resp 17     Temp 98.3 F (36.8 C)     Temp Source Oral     SpO2 96 %     Weight 135 lb (61.2 kg)     Height 5\' 2"  (1.575 m)     Head Circumference      Peak Flow      Pain Score 7     Pain Loc      Pain Edu?      Excl. in GC?     Constitutional: Alert and oriented. Well appearing and in no distress. Eyes: Conjunctivae are normal. Normal extraocular movements.  Eyes were examined using fluorescein and tetracaine without any sign of dendritic pattern or corneal abnormality ENT      Head: Normocephalic and atraumatic.  Left facial erythema with some questionable vesicles, mild swelling      Nose: No congestion/rhinnorhea.      Mouth/Throat: Mucous membranes are moist.      Neck: No stridor. Musculoskeletal: Nontender with normal range of motion in extremities. No lower extremity tenderness nor edema. Skin: Left-sided facial erythema with questionable vesicular formation as noted above ____________________________________________  ED COURSE:  As part of my medical decision making, I reviewed the following data within the electronic MEDICAL RECORD NUMBER History obtained from family if available, nursing notes, old chart and ekg, as well as notes from prior ED visits. Patient presented for possible shingles, we will assess with labs and imaging as indicated at this time.   Procedures  Valerie CramJanice R Wahlen was evaluated in Emergency Department on 12/23/2018 for the symptoms described in the history of present illness. She was evaluated in the context of the global COVID-19 pandemic, which necessitated consideration that the patient might be at risk for infection with the SARS-CoV-2 virus that causes COVID-19. Institutional  protocols and algorithms that pertain to the evaluation of patients at risk for COVID-19 are in a state of rapid change based on information released by regulatory bodies including the CDC and federal and state organizations. These policies and algorithms were followed during the patient's care in the ED.  ____________________________________________   DIFFERENTIAL DIAGNOSIS   Shingles, cellulitis, early abscess, rash  FINAL ASSESSMENT AND PLAN  Shingles, medication refill   Plan: The patient had presented  for left-sided facial rash.  This most resemble shingles although she could be developing facial cellulitis.  I will prescribe Bactroban ointment as well as place her on prednisone and antiviral medicine.  I will also try to refill some of her medications as she is not established with a primary care provider.   Ulice Dash, MD    Note: This note was generated in part or whole with voice recognition software. Voice recognition is usually quite accurate but there are transcription errors that can and very often do occur. I apologize for any typographical errors that were not detected and corrected.     Emily Filbert, MD 12/23/18 1102

## 2023-03-07 DEATH — deceased
